# Patient Record
Sex: Male | Born: 1975 | Hispanic: No | Marital: Married | Smoking: Never smoker
Health system: Southern US, Community
[De-identification: ages and names within clinical notes are randomized; demographics above are authoritative.]

## PROBLEM LIST (undated history)

## (undated) DIAGNOSIS — E119 Type 2 diabetes mellitus without complications: Secondary | ICD-10-CM

---

## 1999-12-11 ENCOUNTER — Emergency Department (HOSPITAL_COMMUNITY): Admission: EM | Admit: 1999-12-11 | Discharge: 1999-12-11 | Payer: Self-pay | Admitting: Emergency Medicine

## 1999-12-11 ENCOUNTER — Encounter: Payer: Self-pay | Admitting: Emergency Medicine

## 1999-12-14 ENCOUNTER — Emergency Department (HOSPITAL_COMMUNITY): Admission: EM | Admit: 1999-12-14 | Discharge: 1999-12-14 | Payer: Self-pay | Admitting: Emergency Medicine

## 2001-07-24 ENCOUNTER — Encounter: Payer: Self-pay | Admitting: *Deleted

## 2001-07-24 ENCOUNTER — Emergency Department (HOSPITAL_COMMUNITY): Admission: EM | Admit: 2001-07-24 | Discharge: 2001-07-24 | Payer: Self-pay | Admitting: *Deleted

## 2002-01-22 ENCOUNTER — Emergency Department (HOSPITAL_COMMUNITY): Admission: EM | Admit: 2002-01-22 | Discharge: 2002-01-23 | Payer: Self-pay | Admitting: Emergency Medicine

## 2002-05-22 ENCOUNTER — Emergency Department (HOSPITAL_COMMUNITY): Admission: EM | Admit: 2002-05-22 | Discharge: 2002-05-23 | Payer: Self-pay | Admitting: Emergency Medicine

## 2002-05-23 ENCOUNTER — Ambulatory Visit (HOSPITAL_COMMUNITY): Admission: RE | Admit: 2002-05-23 | Discharge: 2002-05-23 | Payer: Self-pay | Admitting: Orthopedic Surgery

## 2002-05-23 ENCOUNTER — Encounter: Payer: Self-pay | Admitting: Orthopedic Surgery

## 2003-02-14 ENCOUNTER — Ambulatory Visit (HOSPITAL_COMMUNITY): Admission: RE | Admit: 2003-02-14 | Discharge: 2003-02-14 | Payer: Self-pay | Admitting: Surgery

## 2004-03-08 ENCOUNTER — Emergency Department (HOSPITAL_COMMUNITY): Admission: EM | Admit: 2004-03-08 | Discharge: 2004-03-08 | Payer: Self-pay | Admitting: Emergency Medicine

## 2007-04-09 ENCOUNTER — Emergency Department (HOSPITAL_COMMUNITY): Admission: EM | Admit: 2007-04-09 | Discharge: 2007-04-09 | Payer: Self-pay | Admitting: Emergency Medicine

## 2008-08-15 ENCOUNTER — Inpatient Hospital Stay: Payer: Self-pay | Admitting: Otolaryngology

## 2010-07-26 ENCOUNTER — Emergency Department (HOSPITAL_COMMUNITY): Admission: EM | Admit: 2010-07-26 | Discharge: 2010-07-27 | Payer: Self-pay | Admitting: Emergency Medicine

## 2010-07-28 ENCOUNTER — Emergency Department (HOSPITAL_COMMUNITY): Admission: EM | Admit: 2010-07-28 | Discharge: 2010-07-28 | Payer: Self-pay | Admitting: Emergency Medicine

## 2010-07-30 ENCOUNTER — Emergency Department (HOSPITAL_COMMUNITY): Admission: EM | Admit: 2010-07-30 | Discharge: 2010-07-30 | Payer: Self-pay | Admitting: Emergency Medicine

## 2010-12-04 LAB — POCT I-STAT 3, ART BLOOD GAS (G3+)
Bicarbonate: 22.1 mEq/L (ref 20.0–24.0)
TCO2: 23 mmol/L (ref 0–100)
pCO2 arterial: 30.6 mmHg — ABNORMAL LOW (ref 35.0–45.0)
pH, Arterial: 7.467 — ABNORMAL HIGH (ref 7.350–7.450)

## 2010-12-04 LAB — COMPREHENSIVE METABOLIC PANEL
BUN: 17 mg/dL (ref 6–23)
Calcium: 9.4 mg/dL (ref 8.4–10.5)
Glucose, Bld: 93 mg/dL (ref 70–99)
Total Protein: 7.9 g/dL (ref 6.0–8.3)

## 2010-12-04 LAB — GLUCOSE, CSF: Glucose, CSF: 54 mg/dL (ref 43–76)

## 2010-12-04 LAB — CSF CELL COUNT WITH DIFFERENTIAL
RBC Count, CSF: 732 /mm3 — ABNORMAL HIGH
WBC, CSF: 2 /mm3 (ref 0–5)
WBC, CSF: 4 /mm3 (ref 0–5)

## 2010-12-04 LAB — GRAM STAIN

## 2010-12-04 LAB — CBC
HCT: 39.6 % (ref 39.0–52.0)
MCHC: 35.4 g/dL (ref 30.0–36.0)
MCV: 86.1 fL (ref 78.0–100.0)
RDW: 13.5 % (ref 11.5–15.5)

## 2010-12-04 LAB — DIFFERENTIAL
Basophils Relative: 0 % (ref 0–1)
Lymphs Abs: 1 10*3/uL (ref 0.7–4.0)
Monocytes Relative: 3 % (ref 3–12)
Neutro Abs: 7.4 10*3/uL (ref 1.7–7.7)
Neutrophils Relative %: 85 % — ABNORMAL HIGH (ref 43–77)

## 2010-12-04 LAB — RAPID URINE DRUG SCREEN, HOSP PERFORMED
Amphetamines: NOT DETECTED
Barbiturates: NOT DETECTED
Opiates: NOT DETECTED

## 2010-12-04 LAB — PROTEIN, CSF: Total  Protein, CSF: 27 mg/dL (ref 15–45)

## 2010-12-04 LAB — CSF CULTURE W GRAM STAIN

## 2011-02-08 NOTE — Op Note (Signed)
   Gary Schmitt, Gary Schmitt                          ACCOUNT NO.:  [id number]   MEDICAL RECORD NO.:  [id number]                   PATIENT TYPE:  AMB   LOCATION:  DAY                                  FACILITY:  [facility]   PHYSICIAN:  Sandria Bales. Ezzard Standing, M.D.               DATE OF BIRTH:  1975-12-14   DATE OF PROCEDURE:  02/14/2003  DATE OF DISCHARGE:                                 OPERATIVE REPORT   PREOPERATIVE DIAGNOSIS:  Posterior anal fissure.   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   PROCEDURE:  Rigid sigmoidoscopy to 15 cm, anal ultrasound, left lateral  internal sphincterotomy.   SURGEON:  Sandria Bales. Ezzard Standing, M.D.   ANESTHESIA:  General LMA anesthesia.   COMPLICATIONS:  None.   INDICATIONS FOR PROCEDURE:  Gary Schmitt is a 35 year old male from [place]  who has had chronic rectal pain for about 6-8 months. On physical exam, he  is felt to have a posterior anal fissure but I really can not examine him  because he is so tender. He now comes for operative intervention with a left  lateral internal sphincterotomy.   The patient completed a GoLYTELY bowel prep at home. I did a rigid  sigmoidoscopy at the initiation of the procedure about 15 cm. He had a fair  amount of loose stools still in his rectum which I had some trouble cleaning  out but saw no mucosal defect below 15 cm. I did an anal ultrasound and this  shows a defect posteriorly where he has a posterior anal fissure with kind  of a chronic infection around this and a sentinel tag.   I then did a left lateral internal sphincterotomy under a closed  sphincterotomy and with palpation of the my finger dividing the internal  sphincter on the left side. I then held pressure for 10 minutes to control  bleeding. I then re-ultrasound him identifying both the posterior inflamed  component and the sphincterotomy on the left side. I then packed him with  Gelfoam gauze and Nupercaine  ointment. The patient tolerated the procedure  well and was transferred to the recovery room in good condition. He is  planning to go home today.                                               Sandria Bales. Ezzard Standing, M.D.    DHN/MEDQ  D:  02/14/2003  T:  02/14/2003  Job:  [id number]   cc:   Vikki Ports, M.D.  [address]  [place]  [place] [address]  Fax: [phone]

## 2011-11-02 ENCOUNTER — Encounter (HOSPITAL_COMMUNITY): Payer: Self-pay | Admitting: *Deleted

## 2011-11-02 ENCOUNTER — Emergency Department (HOSPITAL_COMMUNITY)
Admission: EM | Admit: 2011-11-02 | Discharge: 2011-11-02 | Disposition: A | Discharge status: Home / Self Care | Payer: No Typology Code available for payment source | Attending: Emergency Medicine | Admitting: Emergency Medicine

## 2011-11-02 ENCOUNTER — Emergency Department (HOSPITAL_COMMUNITY): Payer: No Typology Code available for payment source

## 2011-11-02 DIAGNOSIS — M545 Low back pain, unspecified: Secondary | ICD-10-CM | POA: Insufficient documentation

## 2011-11-02 DIAGNOSIS — M25559 Pain in unspecified hip: Secondary | ICD-10-CM | POA: Insufficient documentation

## 2011-11-02 DIAGNOSIS — M542 Cervicalgia: Secondary | ICD-10-CM | POA: Insufficient documentation

## 2011-11-02 DIAGNOSIS — S39012A Strain of muscle, fascia and tendon of lower back, initial encounter: Secondary | ICD-10-CM

## 2011-11-02 DIAGNOSIS — S335XXA Sprain of ligaments of lumbar spine, initial encounter: Secondary | ICD-10-CM | POA: Insufficient documentation

## 2011-11-02 DIAGNOSIS — S139XXA Sprain of joints and ligaments of unspecified parts of neck, initial encounter: Secondary | ICD-10-CM | POA: Insufficient documentation

## 2011-11-02 DIAGNOSIS — M533 Sacrococcygeal disorders, not elsewhere classified: Secondary | ICD-10-CM | POA: Insufficient documentation

## 2011-11-02 DIAGNOSIS — IMO0002 Reserved for concepts with insufficient information to code with codable children: Secondary | ICD-10-CM

## 2011-11-02 MED ORDER — HYDROCODONE-ACETAMINOPHEN 5-325 MG PO TABS: 1.0000 | ORAL_TABLET | Freq: Once | ORAL | Status: DC

## 2011-11-02 MED ORDER — HYDROCODONE-ACETAMINOPHEN 5-325 MG PO TABS: 1.0000 | ORAL_TABLET | Freq: Four times a day (QID) | ORAL | Status: AC | PRN
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
## 2013-07-30 ENCOUNTER — Ambulatory Visit: Payer: 59 | Admitting: Physician Assistant

## 2013-07-30 VITALS — BP 132/72 | HR 60 | Temp 98.2°F | Resp 18 | Ht 77.0 in | Wt 201.6 lb

## 2013-07-30 DIAGNOSIS — J029 Acute pharyngitis, unspecified: Secondary | ICD-10-CM

## 2013-07-30 DIAGNOSIS — J3489 Other specified disorders of nose and nasal sinuses: Secondary | ICD-10-CM

## 2013-07-30 DIAGNOSIS — R0981 Nasal congestion: Secondary | ICD-10-CM

## 2013-07-30 DIAGNOSIS — J329 Chronic sinusitis, unspecified: Secondary | ICD-10-CM

## 2013-07-30 MED ORDER — AMOXICILLIN 875 MG PO TABS: 875.0000 mg | ORAL_TABLET | Freq: Two times a day (BID) | ORAL | Status: DC

## 2013-07-30 MED ORDER — IPRATROPIUM BROMIDE 0.03 % NA SOLN: 2.0000 | Freq: Two times a day (BID) | NASAL | Status: DC

## 2013-07-30 MED ORDER — FIRST-DUKES MOUTHWASH MT SUSP: 5.0000 mL | OROMUCOSAL | Status: DC | PRN

## 2013-07-30 NOTE — Progress Notes (Signed)
  Subjective:    Patient ID: Gary Schmitt, male    DOB: 1976-06-08, 37 y.o.   MRN: [id number]  HPI 37 year old male presents for evaluation of 1 week of fatigue, body aches, nasal congestion, cough, and sore throat.  Symptoms started after a workout on 07/24/13 and have continued to get worse.  No documented fever but has had subjective fever and chills with body aches.  He has been taking Mucinex and Dayquil/Nyquil which have not helped. Denies SOB, wheezing, hemoptysis, otalgia, sinus pain, nausea, or vomiting.  Patient is otherwise healthy with no other concerns today.     Review of Systems  Constitutional: Positive for fever (subjective) and chills.  HENT: Positive for congestion, rhinorrhea, sinus pressure and sore throat. Negative for postnasal drip.   Respiratory: Positive for cough. Negative for chest tightness, shortness of breath and wheezing.   Gastrointestinal: Negative for nausea, vomiting and abdominal pain.  Neurological: Negative for dizziness.       Objective:   Physical Exam  Constitutional: He is oriented to person, place, and time. He appears well-developed and well-nourished.  HENT:  Head: Normocephalic and atraumatic.  Right Ear: Hearing, tympanic membrane, external ear and ear canal normal.  Left Ear: Hearing, tympanic membrane, external ear and ear canal normal.  Mouth/Throat: Uvula is midline, oropharynx is clear and moist and mucous membranes are normal.  Eyes: Conjunctivae are normal.  Neck: Normal range of motion. Neck supple.  Cardiovascular: Normal rate, regular rhythm and normal heart sounds.   Pulmonary/Chest: Effort normal and breath sounds normal.  Lymphadenopathy:    He has no cervical adenopathy.  Neurological: He is alert and oriented to person, place, and time.  Psychiatric: He has a normal mood and affect. His behavior is normal. Judgment and thought content normal.          Assessment & Plan:  Sinusitis - Plan: amoxicillin (AMOXIL) 875  MG tablet  Nasal congestion - Plan: ipratropium (ATROVENT) 0.03 % nasal spray  Acute pharyngitis - Plan: Diphenhyd-Hydrocort-Nystatin (FIRST-DUKES MOUTHWASH) SUSP  Start amoxicillin 875 mg bid x 10 days Atrovent NS twice daily to help with congestion Duke's mouthwash as needed for pain Continue Mucinex. Tylenol or Motrin as needed for fevers  Increase fluids and rest Follow up if symptoms worsen or fail to improve.

## 2014-09-04 ENCOUNTER — Encounter: Payer: Self-pay | Admitting: Physician Assistant

## 2014-09-04 DIAGNOSIS — G43009 Migraine without aura, not intractable, without status migrainosus: Secondary | ICD-10-CM | POA: Insufficient documentation

## 2014-11-07 ENCOUNTER — Ambulatory Visit (INDEPENDENT_AMBULATORY_CARE_PROVIDER_SITE_OTHER): Payer: Managed Care, Other (non HMO) | Admitting: Emergency Medicine

## 2014-11-07 VITALS — BP 122/74 | HR 98 | Temp 98.0°F | Resp 17 | Ht 77.5 in | Wt 217.0 lb

## 2014-11-07 DIAGNOSIS — R369 Urethral discharge, unspecified: Secondary | ICD-10-CM

## 2014-11-07 LAB — HIV ANTIBODY (ROUTINE TESTING W REFLEX): HIV: NONREACTIVE

## 2014-11-07 LAB — RPR

## 2014-11-07 LAB — HEPATITIS C ANTIBODY: HCV Ab: NEGATIVE

## 2014-11-07 MED ORDER — DOXYCYCLINE HYCLATE 100 MG PO TABS: 100.0000 mg | ORAL_TABLET | Freq: Two times a day (BID) | ORAL | Status: DC

## 2014-11-07 NOTE — Progress Notes (Signed)
Subjective:  This chart was scribed for Earl LitesSteve Wille Aubuchon MD, by Lionel DecemberHatice Demirci, ED Scribe. This patient was seen in room 12 and the patient's care was started at 10:48 AM.    Patient ID: Gary Schmitt, male    DOB: 05/05/1976, 39 y.o.   MRN: [id number]  HPI  HPI Comments: Gary Schmitt is a 39 y.o. male who presents to [facility] complaining of intermittent urethral discharge onset 4 days ago. Patient [place] ([place] with his girlfriend of 3-4 years and had unprotected sex 1 time (states that all the other times were protected).  Patient has had similar complaints 2-3 years ago and was given antibiotics which he states alleviated his symptoms. Patient has had no other sexual partners other than his long term girlfriend.  Patient has no other complaints today.  Works for [employer] and travels the world.     Patient Active Problem List   Diagnosis Date Noted  . Migraine without aura and without status migrainosus, not intractable 09/04/2014   History reviewed. No pertinent past medical history. History reviewed. No pertinent past surgical history. No Known Allergies Prior to Admission medications   Medication Sig Start Date End Date Taking? Authorizing Provider  amoxicillin (AMOXIL) 875 MG tablet Take 1 tablet (875 mg total) by mouth 2 (two) times daily. Patient not taking: Reported on 11/07/2014 07/30/13   Nelva NayHeather M Marte, PA-C  Diphenhyd-Hydrocort-Nystatin (FIRST-DUKES MOUTHWASH) SUSP Use as directed 5-10 mLs in the mouth or throat every 2 (two) hours as needed. Use 1:1 ratio with viscous lidocaine Patient not taking: Reported on 11/07/2014 07/30/13   Nelva NayHeather M Marte, PA-C  ipratropium (ATROVENT) 0.03 % nasal spray Place 2 sprays into the nose 2 (two) times daily. Patient not taking: Reported on 11/07/2014 07/30/13   Nelva NayHeather M Marte, PA-C  Multiple Vitamin (MULTIVITAMIN) tablet Take 1 tablet by mouth daily.    Historical Provider, MD   History   Social  History  . Marital Status: Married    Spouse Name: N/A  . Number of Children: N/A  . Years of Education: N/A   Occupational History  . Not on file.   Social History Main Topics  . Smoking status: Never Smoker   . Smokeless tobacco: Not on file  . Alcohol Use: No  . Drug Use: No  . Sexual Activity: Yes    Birth Control/ Protection: None   Other Topics Concern  . Not on file   Social History Narrative     Review of Systems  Constitutional: Negative for fever and chills.  Respiratory: Negative for shortness of breath.   Cardiovascular: Negative for chest pain.  Genitourinary: Positive for discharge.       Objective:   Physical Exam  CONSTITUTIONAL: Well developed/well nourished HEAD: Normocephalic/atraumatic EYES: EOMI/PERRL ENMT: Mucous membranes moist NECK: supple no meningeal signs SPINE/BACK:entire spine nontender CV: S1/S2 noted, no murmurs/rubs/gallops noted LUNGS: Lungs are clear to auscultation bilaterally, no apparent distress ABDOMEN: soft, nontender, no rebound or guarding, bowel sounds noted throughout abdomen GU:no cva tenderness NEURO: Pt is awake/alert/appropriate, moves all extremitiesx4.  No facial droop.   EXTREMITIES: pulses normal/equal, full ROM SKIN: warm, color normal PSYCH: no abnormalities of mood noted, alert and oriented to situation GENITALS: normal except for a watery minimal discharge.     Filed Vitals:   11/07/14 1041  BP: 122/74  Pulse: 98  Temp: 98 F (36.7 C)  TempSrc: Oral  Resp: 17  Height: 6' 5.5" (1.969 m)  Weight: 217 lb (98.431 kg)  SpO2: 100%         Assessment & Plan:  Patient placed on doxycycline twice a day STD screening labs were all done.I personally performed the services described in this documentation, which was scribed in my presence. The recorded information has been reviewed and is accurate.

## 2014-11-07 NOTE — Patient Instructions (Signed)
Urethritis °Urethritis is an inflammation of the tube through which urine exits your bladder (urethra).  °CAUSES °Urethritis is often caused by an infection in your urethra. The infection can be viral, like herpes. The infection can also be bacterial, like gonorrhea. °RISK FACTORS °Risk factors of urethritis include: °· Having sex without using a condom. °· Having multiple sexual partners. °· Having poor hygiene. °SIGNS AND SYMPTOMS °Symptoms of urethritis are less noticeable in women than in men. These symptoms include: °· Burning feeling when you urinate (dysuria). °· Discharge from your urethra. °· Blood in your urine (hematuria). °· Urinating more than usual. °DIAGNOSIS  °To confirm a diagnosis of urethritis, your health care provider will do the following: °· Ask about your sexual history. °· Perform a physical exam. °· Have you provide a sample of your urine for lab testing. °· Use a cotton swab to gently collect a sample from your urethra for lab testing. °TREATMENT  °It is important to treat urethritis. Depending on the cause, untreated urethritis may lead to serious genital infections and possibly infertility. Urethritis caused by a bacterial infection is treated with antibiotic medicine. All sexual partners must be treated.  °HOME CARE INSTRUCTIONS °· Do not have sex until the test results are known and treatment is completed, even if your symptoms go away before you finish treatment. °· If you were prescribed an antibiotic, finish it all even if you start to feel better. °SEEK MEDICAL CARE IF:  °· Your symptoms are not improved in 3 days. °· Your symptoms are getting worse. °· You develop abdominal pain or pelvic pain (in women). °· You develop joint pain. °· You have a fever. °SEEK IMMEDIATE MEDICAL CARE IF:  °· You have severe pain in the belly, back, or side. °· You have repeated vomiting. °MAKE SURE YOU: °· Understand these instructions. °· Will watch your condition. °· Will get help right away if you  are not doing well or get worse. °Document Released: 03/05/2001 Document Revised: 01/24/2014 Document Reviewed: 05/10/2013 °ExitCare® Patient Information ©2015 [employer]. This information is not intended to replace advice given to you by your health care provider. Make sure you discuss any questions you have with your health care provider. ° °

## 2014-11-08 ENCOUNTER — Telehealth: Payer: Self-pay

## 2014-11-08 LAB — GC/CHLAMYDIA PROBE AMP
CT Probe RNA: POSITIVE — AB
GC PROBE AMP APTIMA: NEGATIVE

## 2014-11-08 LAB — HSV(HERPES SIMPLEX VRS) I + II AB-IGG
HSV 1 Glycoprotein G Ab, IgG: 11.65 IV — ABNORMAL HIGH
HSV 2 Glycoprotein G Ab, IgG: <0.1 IV

## 2014-11-08 MED ORDER — AZITHROMYCIN 500 MG PO TABS: 1000.0000 mg | ORAL_TABLET | Freq: Every day | ORAL | Status: DC

## 2014-11-08 NOTE — Telephone Encounter (Signed)
He can either take the Vibra tabs or he can have Zithromax 1 g 1 dose

## 2014-11-08 NOTE — Telephone Encounter (Signed)
Is there any substitutes for doxycycline?

## 2014-11-08 NOTE — Telephone Encounter (Signed)
I sent in 1 gram of Zithromax to his pharmacy. Pt notified.

## 2014-11-08 NOTE — Telephone Encounter (Signed)
Patient states he cannot take the meds prescribed yesterday.  They have a plastic coat on them and he is allergic to it.   Please call in a tablet. doxycycline (VIBRA-TABS) 100 MG tablet

## 2015-05-05 ENCOUNTER — Encounter (HOSPITAL_COMMUNITY): Payer: Self-pay | Admitting: Emergency Medicine

## 2015-05-05 ENCOUNTER — Emergency Department (HOSPITAL_COMMUNITY)
Admission: EM | Admit: 2015-05-05 | Discharge: 2015-05-05 | Disposition: A | Discharge status: Home / Self Care | Payer: Managed Care, Other (non HMO) | Attending: Emergency Medicine | Admitting: Emergency Medicine

## 2015-05-05 DIAGNOSIS — K273 Acute peptic ulcer, site unspecified, without hemorrhage or perforation: Secondary | ICD-10-CM | POA: Diagnosis not present

## 2015-05-05 DIAGNOSIS — R109 Unspecified abdominal pain: Secondary | ICD-10-CM | POA: Diagnosis present

## 2015-05-05 DIAGNOSIS — Z79899 Other long term (current) drug therapy: Secondary | ICD-10-CM | POA: Insufficient documentation

## 2015-05-05 DIAGNOSIS — K279 Peptic ulcer, site unspecified, unspecified as acute or chronic, without hemorrhage or perforation: Secondary | ICD-10-CM

## 2015-05-05 LAB — COMPREHENSIVE METABOLIC PANEL
ALK PHOS: 55 U/L (ref 38–126)
ALT: 59 U/L (ref 17–63)
ANION GAP: 10 (ref 5–15)
AST: 72 U/L — ABNORMAL HIGH (ref 15–41)
Albumin: 4.3 g/dL (ref 3.5–5.0)
BILIRUBIN TOTAL: 1.1 mg/dL (ref 0.3–1.2)
BUN: 12 mg/dL (ref 6–20)
CHLORIDE: 106 mmol/L (ref 101–111)
CO2: 22 mmol/L (ref 22–32)
CREATININE: 1.22 mg/dL (ref 0.61–1.24)
Calcium: 10.1 mg/dL (ref 8.9–10.3)
Glucose, Bld: 84 mg/dL (ref 65–99)
POTASSIUM: 4.2 mmol/L (ref 3.5–5.1)
Sodium: 138 mmol/L (ref 135–145)
Total Protein: 7 g/dL (ref 6.5–8.1)

## 2015-05-05 LAB — URINALYSIS, ROUTINE W REFLEX MICROSCOPIC
BILIRUBIN URINE: NEGATIVE
GLUCOSE, UA: NEGATIVE mg/dL
Hgb urine dipstick: NEGATIVE
Ketones, ur: NEGATIVE mg/dL
NITRITE: NEGATIVE
PH: 6.5 (ref 5.0–8.0)
Protein, ur: NEGATIVE mg/dL
SPECIFIC GRAVITY, URINE: 1.008 (ref 1.005–1.030)
Urobilinogen, UA: 0.2 mg/dL (ref 0.0–1.0)

## 2015-05-05 LAB — LIPASE, BLOOD: Lipase: 17 U/L — ABNORMAL LOW (ref 22–51)

## 2015-05-05 LAB — CBC
HCT: 39.8 % (ref 39.0–52.0)
Hemoglobin: 14 g/dL (ref 13.0–17.0)
MCH: 30.2 pg (ref 26.0–34.0)
MCHC: 35.2 g/dL (ref 30.0–36.0)
MCV: 85.8 fL (ref 78.0–100.0)
PLATELETS: 152 10*3/uL (ref 150–400)
RBC: 4.64 MIL/uL (ref 4.22–5.81)
RDW: 13.3 % (ref 11.5–15.5)
WBC: 7.3 10*3/uL (ref 4.0–10.5)

## 2015-05-05 LAB — URINE MICROSCOPIC-ADD ON

## 2015-05-05 MED ORDER — ESOMEPRAZOLE MAGNESIUM 40 MG PO CPDR: 40.0000 mg | DELAYED_RELEASE_CAPSULE | Freq: Every day | ORAL | Status: AC

## 2015-05-05 MED ORDER — PANTOPRAZOLE SODIUM 40 MG IV SOLR
40.0000 mg | Freq: Once | INTRAVENOUS | Status: AC
  Administered 2015-05-05: 40 mg via INTRAVENOUS
  Filled 2015-05-05: qty 40

## 2015-05-05 MED ORDER — ONDANSETRON HCL 4 MG/2ML IJ SOLN
4.0000 mg | Freq: Once | INTRAMUSCULAR | Status: AC
  Administered 2015-05-05: 4 mg via INTRAVENOUS
  Filled 2015-05-05: qty 2

## 2015-05-05 MED ORDER — SODIUM CHLORIDE 0.9 % IV BOLUS (SEPSIS)
1000.0000 mL | Freq: Once | INTRAVENOUS | Status: AC
  Administered 2015-05-05: 1000 mL via INTRAVENOUS

## 2015-05-05 MED ORDER — PROMETHAZINE HCL 25 MG PO TABS: 25.0000 mg | ORAL_TABLET | Freq: Four times a day (QID) | ORAL | Status: AC | PRN

## 2015-05-05 MED ORDER — DEXTROSE 5 % IV SOLN: 1.0000 g | Freq: Once | INTRAVENOUS | Status: DC

## 2015-05-05 NOTE — Discharge Instructions (Signed)
Peptic Ulcer A peptic ulcer is a sore in the lining of your esophagus (esophageal ulcer), stomach (gastric ulcer), or in the first part of your small intestine (duodenal ulcer). The ulcer causes erosion into the deeper tissue. CAUSES  Normally, the lining of the stomach and the small intestine protects itself from the acid that digests food. The protective lining can be damaged by:  An infection caused by a bacterium called Helicobacter pylori (H. pylori).  Regular use of nonsteroidal anti-inflammatory drugs (NSAIDs), such as ibuprofen or aspirin.  Smoking tobacco. Other risk factors include being older than 50, drinking alcohol excessively, and having a family history of ulcer disease.  SYMPTOMS   Burning pain or gnawing in the area between the chest and the belly button.  Heartburn.  Nausea and vomiting.  Bloating. The pain can be worse on an empty stomach and at night. If the ulcer results in bleeding, it can cause:  Black, tarry stools.  Vomiting of bright red blood.  Vomiting of coffee-ground-looking materials. DIAGNOSIS  A diagnosis is usually made based upon your history and an exam. Other tests and procedures may be performed to find the cause of the ulcer. Finding a cause will help determine the best treatment. Tests and procedures may include:  Blood tests, stool tests, or breath tests to check for the bacterium H. pylori.  An upper gastrointestinal (GI) series of the esophagus, stomach, and small intestine.  An endoscopy to examine the esophagus, stomach, and small intestine.  A biopsy. TREATMENT  Treatment may include:  Eliminating the cause of the ulcer, such as smoking, NSAIDs, or alcohol.  Medicines to reduce the amount of acid in your digestive tract.  Antibiotic medicines if the ulcer is caused by the H. pylori bacterium.  An upper endoscopy to treat a bleeding ulcer.  Surgery if the bleeding is severe or if the ulcer created a hole somewhere in the  digestive system. HOME CARE INSTRUCTIONS   Avoid tobacco, alcohol, and caffeine. Smoking can increase the acid in the stomach, and continued smoking will impair the healing of ulcers.  Avoid foods and drinks that seem to cause discomfort or aggravate your ulcer.  Only take medicines as directed by your caregiver. Do not substitute over-the-counter medicines for prescription medicines without talking to your caregiver.  Keep any follow-up appointments and tests as directed. SEEK MEDICAL CARE IF:   Your do not improve within 7 days of starting treatment.  You have ongoing indigestion or heartburn. SEEK IMMEDIATE MEDICAL CARE IF:   You have sudden, sharp, or persistent abdominal pain.  You have bloody or dark black, tarry stools.  You vomit blood or vomit that looks like coffee grounds.  You become light-headed, weak, or feel faint.  You become sweaty or clammy. MAKE SURE YOU:   Understand these instructions.  Will watch your condition.  Will get help right away if you are not doing well or get worse. Document Released: 09/06/2000 Document Revised: 01/24/2014 Document Reviewed: 04/08/2012 ExitCare Patient Information 2015 [employer]. This information is not intended to replace advice given to you by your health care provider. Make sure you discuss any questions you have with your health care provider.  

## 2015-05-05 NOTE — ED Notes (Signed)
Patient states abdominal pain that started this morning.   Patient states N/V, patient had to "make my self throw up".  Patient states he took some pepto bismol at home, but no relief.

## 2015-05-05 NOTE — ED Provider Notes (Signed)
CSN: [id number]     Arrival date & time 05/05/15  1449 History   First MD Initiated Contact with Patient 05/05/15 1529     Chief Complaint  Patient presents with  . Abdominal Pain      HPI  Expand All Collapse All   Patient states abdominal pain that started this morning. Patient states N/V, patient had to "make my self throw up". Patient states he took some pepto bismol at home, but no relief.        History reviewed. No pertinent past medical history. History reviewed. No pertinent past surgical history. No family history on file. Social History  Substance Use Topics  . Smoking status: Never Smoker   . Smokeless tobacco: None  . Alcohol Use: No    Review of Systems All other systems reviewed and are negative   Allergies  Review of patient's allergies indicates no known allergies.  Home Medications   Prior to Admission medications   Medication Sig Start Date End Date Taking? Authorizing Provider  bismuth subsalicylate (PEPTO BISMOL) 262 MG/15ML suspension Take 30 mLs by mouth once.   Yes Historical Provider, MD  amoxicillin (AMOXIL) 875 MG tablet Take 1 tablet (875 mg total) by mouth 2 (two) times daily. Patient not taking: Reported on 11/07/2014 07/30/13   Nelva Nay, PA-C  azithromycin (ZITHROMAX) 500 MG tablet Take 2 tablets (1,000 mg total) by mouth daily. Patient not taking: Reported on 05/05/2015 11/08/14   Collene Gobble, MD  Diphenhyd-Hydrocort-Nystatin (FIRST-DUKES MOUTHWASH) SUSP Use as directed 5-10 mLs in the mouth or throat every 2 (two) hours as needed. Use 1:1 ratio with viscous lidocaine Patient not taking: Reported on 11/07/2014 07/30/13   Nelva Nay, PA-C  doxycycline (VIBRA-TABS) 100 MG tablet Take 1 tablet (100 mg total) by mouth 2 (two) times daily. Patient not taking: Reported on 05/05/2015 11/07/14   Collene Gobble, MD  esomeprazole (NEXIUM) 40 MG capsule Take 1 capsule (40 mg total) by mouth daily. 05/05/15   Nelva Nay, MD  ipratropium  (ATROVENT) 0.03 % nasal spray Place 2 sprays into the nose 2 (two) times daily. Patient not taking: Reported on 11/07/2014 07/30/13   Nelva Nay, PA-C  promethazine (PHENERGAN) 25 MG tablet Take 1 tablet (25 mg total) by mouth every 6 (six) hours as needed for nausea or vomiting. 05/05/15   Nelva Nay, MD   BP 123/63 mmHg  Pulse 57  Temp(Src) 98.2 F (36.8 C) (Oral)  Resp 16  Ht 6\' 7"  (2.007 m)  Wt 230 lb (104.327 kg)  BMI 25.90 kg/m2  SpO2 100% Physical Exam Physical Exam  Nursing note and vitals reviewed. Constitutional: He is oriented to person, place, and time. He appears well-developed and well-nourished. No distress.  HENT:  Head: Normocephalic and atraumatic.  Eyes: Pupils are equal, round, and reactive to light.  Neck: Normal range of motion.  Cardiovascular: Normal rate and intact distal pulses.   Pulmonary/Chest: No respiratory distress.  Abdominal: Normal appearance. He exhibits no distension.  Tenderness to palpation in the epigastric area.  No rebound guarding tenderness.  Active bowel sounds all quadrants.  No right lower quadrant pain.   Musculoskeletal: Normal range of motion.  Neurological: He is alert and oriented to person, place, and time. No cranial nerve deficit.  Skin: Skin is warm and dry. No rash noted.  Psychiatric: He has a normal mood and affect. His behavior is normal.   ED Course  Procedures (including critical care time) Labs Review Labs  Reviewed  LIPASE, BLOOD - Abnormal; Notable for the following:    Lipase 17 (*)    All other components within normal limits  COMPREHENSIVE METABOLIC PANEL - Abnormal; Notable for the following:    AST 72 (*)    All other components within normal limits  URINALYSIS, ROUTINE W REFLEX MICROSCOPIC (NOT AT [facility]) - Abnormal; Notable for the following:    Leukocytes, UA TRACE (*)    All other components within normal limits  CBC  URINE MICROSCOPIC-ADD ON    Imaging Review No results found. I, Jayanth Szczesniak  L, personally reviewed and evaluated these images and lab results as part of my medical decision-making.  After treatment in the ED the patient feels back to baseline and wants to go home.  MDM   Final diagnoses:  PUD (peptic ulcer disease)        Nelva Nay, MD 05/05/15 [phone]

## 2016-10-27 ENCOUNTER — Other Ambulatory Visit: Payer: Self-pay

## 2016-10-27 ENCOUNTER — Encounter (HOSPITAL_COMMUNITY): Payer: Self-pay

## 2016-10-27 ENCOUNTER — Emergency Department (HOSPITAL_COMMUNITY)
Admission: EM | Admit: 2016-10-27 | Discharge: 2016-10-27 | Disposition: A | Discharge status: Home / Self Care | Payer: Managed Care, Other (non HMO) | Attending: Emergency Medicine | Admitting: Emergency Medicine

## 2016-10-27 DIAGNOSIS — Z79899 Other long term (current) drug therapy: Secondary | ICD-10-CM | POA: Insufficient documentation

## 2016-10-27 DIAGNOSIS — E119 Type 2 diabetes mellitus without complications: Secondary | ICD-10-CM | POA: Diagnosis not present

## 2016-10-27 DIAGNOSIS — R55 Syncope and collapse: Secondary | ICD-10-CM

## 2016-10-27 DIAGNOSIS — R42 Dizziness and giddiness: Secondary | ICD-10-CM | POA: Insufficient documentation

## 2016-10-27 HISTORY — DX: Type 2 diabetes mellitus without complications: E11.9

## 2016-10-27 LAB — URINALYSIS, ROUTINE W REFLEX MICROSCOPIC
Bacteria, UA: NONE SEEN
Bilirubin Urine: NEGATIVE
GLUCOSE, UA: NEGATIVE mg/dL
KETONES UR: NEGATIVE mg/dL
NITRITE: NEGATIVE
Protein, ur: NEGATIVE mg/dL
Specific Gravity, Urine: 1.014 (ref 1.005–1.030)
Squamous Epithelial / LPF: NONE SEEN
WBC, UA: NONE SEEN WBC/hpf (ref 0–5)
pH: 5 (ref 5.0–8.0)

## 2016-10-27 LAB — CBC
HEMATOCRIT: 45.3 % (ref 39.0–52.0)
Hemoglobin: 16.1 g/dL (ref 13.0–17.0)
MCH: 30.6 pg (ref 26.0–34.0)
MCHC: 35.5 g/dL (ref 30.0–36.0)
MCV: 86.1 fL (ref 78.0–100.0)
Platelets: 205 10*3/uL (ref 150–400)
RBC: 5.26 MIL/uL (ref 4.22–5.81)
RDW: 12.9 % (ref 11.5–15.5)
WBC: 5.9 10*3/uL (ref 4.0–10.5)

## 2016-10-27 LAB — BASIC METABOLIC PANEL
Anion gap: 11 (ref 5–15)
BUN: 21 mg/dL — ABNORMAL HIGH (ref 6–20)
CALCIUM: 9.9 mg/dL (ref 8.9–10.3)
CO2: 26 mmol/L (ref 22–32)
Chloride: 98 mmol/L — ABNORMAL LOW (ref 101–111)
Creatinine, Ser: 1.21 mg/dL (ref 0.61–1.24)
GFR calc Af Amer: >60 mL/min (ref >60)
Glucose, Bld: 83 mg/dL (ref 65–99)
POTASSIUM: 4.2 mmol/L (ref 3.5–5.1)
SODIUM: 135 mmol/L (ref 135–145)

## 2016-10-27 LAB — CBG MONITORING, ED: Glucose-Capillary: 71 mg/dL (ref 65–99)

## 2016-10-27 MED ORDER — SODIUM CHLORIDE 0.9 % IV BOLUS (SEPSIS)
1000.0000 mL | Freq: Once | INTRAVENOUS | Status: AC
  Administered 2016-10-27: 1000 mL via INTRAVENOUS

## 2016-10-27 NOTE — Discharge Instructions (Signed)
Please follow up with your primary care provider for re-evaluation and medication reconciliation. Please STOP taking the chlorthalidone. Return to ED if you have persistent dizziness, lightheadedness, chest pain, shortness of breath or if you pass out again.

## 2016-10-27 NOTE — ED Notes (Signed)
Repeat EKG in 15 mins. Per Dr. Clayborne DanaMesner

## 2016-10-27 NOTE — ED Provider Notes (Signed)
[facility]-EMERGENCY DEPT Provider Note   CSN: [id number] Arrival date & time: 10/27/16  1442     History   Chief Complaint Chief Complaint  Patient presents with  . weakness/ took wrong med    HPI Gary Schmitt is a 41 y.o. male.  HPI 41 year old male who states he has no medical problems presenting with dizziness and syncope. He states that 4 days ago he was prescribed an allergy medicine by his primary care doctor however today he realized that he was given the wrong medicine by the pharmacy. He was given chlorthalidone 25 mg to be taken once a day. He is taking this every morning for the past 4 days. He states that he has had mild nausea, polyuria and diarrhea. He states that this morning he noted lightheadedness and dizziness and shortly prior to arrival he stood up and became very dizzy and his friend told him to lay down on the couch. Once he laid down he states that he passed out for an unknown amount of time. He woke up alert and oriented however covered in sweat. He still endorses dizziness when he stands up but feels fine when he is sitting still. He states that he has had an occasional sharp substernal chest pain but is only happen once or twice and is not present at this time. No shortness of breath. The medication was intended for Gary Schmitt.   Past Medical History:  Diagnosis Date  . Diabetes mellitus without complication [facility](HCC)     Patient Active Problem List   Diagnosis Date Noted  . Migraine without aura and without status migrainosus, not intractable 09/04/2014    History reviewed. No pertinent surgical history.     Home Medications    Prior to Admission medications   Medication Sig Start Date End Date Taking? Authorizing Provider  esomeprazole (NEXIUM) 40 MG capsule Take 1 capsule (40 mg total) by mouth daily. Patient not taking: Reported on 10/27/2016 05/05/15   Nelva Nayobert Beaton, MD  promethazine (PHENERGAN) 25 MG tablet Take 1 tablet (25 mg total) by mouth  every 6 (six) hours as needed for nausea or vomiting. Patient not taking: Reported on 10/27/2016 05/05/15   Nelva Nayobert Beaton, MD    Family History No family history on file.  Social History Social History  Substance Use Topics  . Smoking status: Never Smoker  . Smokeless tobacco: Not on file  . Alcohol use No     Allergies   Patient has no known allergies.   Review of Systems Review of Systems  Constitutional: Negative for chills and fever.  HENT: Negative for ear pain and sore throat.   Eyes: Negative for pain and visual disturbance.  Respiratory: Negative for cough and shortness of breath.   Cardiovascular: Negative for chest pain and palpitations.  Gastrointestinal: Negative for abdominal pain and vomiting.  Genitourinary: Negative for dysuria and hematuria.  Musculoskeletal: Negative for arthralgias and back pain.  Skin: Negative for color change and rash.  Neurological: Positive for dizziness, syncope, weakness (generalized) and light-headedness. Negative for seizures.  All other systems reviewed and are negative.    Physical Exam Updated Vital Signs BP 129/64   Pulse 68   Temp 97.7 F (36.5 C) (Oral)   Resp 17   SpO2 100%   Physical Exam  Constitutional: He is oriented to person, place, and time. He appears well-developed and well-nourished.  Healthy appearing male.  HENT:  Head: Normocephalic and atraumatic.  Eyes: Conjunctivae are normal.  Neck: Normal range of  motion. Neck supple.  Cardiovascular: Normal rate, regular rhythm, S1 normal, S2 normal, normal heart sounds, intact distal pulses and normal pulses.  Exam reveals no gallop and no friction rub.   No murmur heard. Pulmonary/Chest: Effort normal and breath sounds normal. No respiratory distress. He has no wheezes. He has no rhonchi.  Abdominal: Soft. There is no tenderness.  Musculoskeletal: He exhibits no edema.  Neurological: He is alert and oriented to person, place, and time. He has normal strength  and normal reflexes. No cranial nerve deficit or sensory deficit. He displays a negative Romberg sign. Coordination and gait normal. GCS eye subscore is 4. GCS verbal subscore is 5. GCS motor subscore is 6.  Skin: Skin is warm and dry. Capillary refill takes less than 2 seconds.  Psychiatric: He has a normal mood and affect.  Nursing note and vitals reviewed.    ED Treatments / Results  Labs (all labs ordered are listed, but only abnormal results are displayed) Labs Reviewed  BASIC METABOLIC PANEL - Abnormal; Notable for the following:       Result Value   Chloride 98 (*)    BUN 21 (*)    All other components within normal limits  URINALYSIS, ROUTINE W REFLEX MICROSCOPIC - Abnormal; Notable for the following:    Color, Urine STRAW (*)    Hgb urine dipstick SMALL (*)    Leukocytes, UA TRACE (*)    All other components within normal limits  CBC  CBG MONITORING, ED    EKG  EKG Interpretation  Date/Time:  Sunday October 27 2016 16:54:49 EST Ventricular Rate:  61 PR Interval:    QRS Duration: 99 QT Interval:  421 QTC Calculation: 424 R Axis:   83 Text Interpretation:  Sinus rhythm Prominent P waves, nondiagnostic ST elevation suggests acute pericarditis Since last tracing 2011, no significant changes seen Abnormal ekg Confirmed by MILLER  MD, BRIAN ([id number]) on 10/27/2016 5:20:09 PM       Radiology No results found.  Procedures Procedures (including critical care time)  Medications Ordered in ED Medications  sodium chloride 0.9 % bolus 1,000 mL (0 mLs Intravenous Stopped 10/27/16 1853)     Initial Impression / Assessment and Plan / ED Course  I have reviewed the triage vital signs and the nursing notes.  Pertinent labs & imaging results that were available during my care of the patient were reviewed by me and considered in my medical decision making (see chart for details).    41yoM Presenting after a syncopal episode. He states that he just rolls today that he has  been taking the wrong medicine for the past 4 days. He has been taking chlorthalidone 25 mg every day. He states for the past 4 days he has had polyuria, nausea and lightheadedness. He seemed possibly earlier today for an unknown amount of time. No seizure-like activity. No concern for postictal state. Alert and oriented on exam with normal vital signs. Exam is unremarkable. Nonfocal neuro exam. Heart sounds are normal and his lungs clear to auscultation bilaterally. EKG showed diffuse ST elevation however is similar to his prior EKG in 2011. He denies any chest pain.. Labs notable for a slightly increased BUN of 21 is likely due to dehydration. Patient given a fluid bolus. Initial orthostatics prior to the bolus was positive however after the bolus was given he no longer had orthostatic hypotension. I instructed the patient to stop taking the chlorthalidone and follow-up with his primary doctor for reevaluation and medication reconciliation.  I also spoke with him about making sure that the medicines that he gets from the pharmacy and have his name on it and are the correct medicines. He was given strict return precautions and discharged in good condition.  Patient care discussed and supervised by my attending, Dr. Hyacinth Meeker. Azalia Bilis, MD   Final Clinical Impressions(s) / ED Diagnoses   Final diagnoses:  Syncope, unspecified syncope type  Lightheadedness    New Prescriptions New Prescriptions   No medications on file     Salimatou Simone [place] Teyla Skidgel, MD 10/27/16 2016    Eber Hong, MD 11/06/16 1005

## 2016-10-27 NOTE — ED Provider Notes (Signed)
I saw and evaluated the patient, reviewed the resident's note and I agree with the findings and plan.  Pertinent History: The patient is a 41 year old male, he has accidentally been taking the wrong medication over the last 5 days as the pharmacy gave him somebody else's medication with the same last name. He has been taking chlorthalidone when he thought that he was supposed to be taking an antihistamine or antiallergy medication. He found out today when he looked at the name on the label and found out that it was the same last name, wrong name, he has been urinating frequently and been feeling more dizzy. No chest pain, no shortness of breath at this time, no syncope, no rashes, no diarrhea  Pertinent Exam findings: The patient appears well, his speech is clear, his cranial nerves III through XII and all 4 extremities seem to be working at baseline, he is a normal healthy young man who has normal muscle tone, normal extremities without edema, soft abdomen, clear heart and lungs.  I was personally present and directly supervised the following procedures:  EKG is slightly abnormal with diffuse ST of normality however this is unchanged from 2011 and the patient has no signs of pericarditis or myocardial infarction. Labs are also otherwise unremarkable. We'll give some IV fluids to replace what has been lost as he is likely relatively dehydrated. Gentle reassurance also given that this should not cause long-term problems. The patient expressed his understanding.  I personally interpreted the EKG as well as the resident and agree with the interpretation on the resident's chart.   EKG Interpretation  Date/Time:  Sunday October 27 2016 16:54:49 EST Ventricular Rate:  61 PR Interval:  146 QRS Duration: 99 QT Interval:  421 QTC Calculation: 424 R Axis:   83 Text Interpretation:  Sinus rhythm Prominent P waves, nondiagnostic ST elevation suggests acute pericarditis Since last tracing 2011, no  significant changes seen Abnormal ekg Confirmed by Hyacinth MeekerMILLER  MD, Brax Walen ([id number]) on 10/27/2016 5:20:09 PM        Final diagnoses:  Syncope, unspecified syncope type  Lightheadedness      Eber HongBrian Roth Ress, MD 11/06/16 1006

## 2016-10-27 NOTE — ED Triage Notes (Signed)
Patient states that he was given the wrong medication at [facility] 4 days ago. Taking BP medication that was not prescribed for him. States he had near syncope this am with abdominal pain, alert and oriented, VSS
